# Patient Record
Sex: Female | Born: 1990 | Race: White | Hispanic: No | Marital: Single | Smoking: Never smoker
Health system: Southern US, Community
[De-identification: ages and names within clinical notes are randomized; demographics above are authoritative.]

## PROBLEM LIST (undated history)

## (undated) DIAGNOSIS — Q210 Ventricular septal defect: Secondary | ICD-10-CM

## (undated) DIAGNOSIS — I369 Nonrheumatic tricuspid valve disorder, unspecified: Secondary | ICD-10-CM

## (undated) DIAGNOSIS — I509 Heart failure, unspecified: Secondary | ICD-10-CM

## (undated) DIAGNOSIS — R011 Cardiac murmur, unspecified: Secondary | ICD-10-CM

## (undated) DIAGNOSIS — Q224 Congenital tricuspid stenosis: Secondary | ICD-10-CM

## (undated) HISTORY — DX: Congenital tricuspid stenosis: Q22.4

## (undated) HISTORY — DX: Ventricular septal defect: Q21.0

## (undated) HISTORY — DX: Cardiac murmur, unspecified: R01.1

## (undated) HISTORY — DX: Nonrheumatic tricuspid valve disorder, unspecified: I36.9

---
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
## 2009-02-22 ENCOUNTER — Ambulatory Visit: Payer: Self-pay | Admitting: Family Medicine

## 2009-02-22 DIAGNOSIS — M4120 Other idiopathic scoliosis, site unspecified: Secondary | ICD-10-CM | POA: Insufficient documentation

## 2009-02-22 DIAGNOSIS — M545 Low back pain: Secondary | ICD-10-CM

## 2011-03-08 ENCOUNTER — Other Ambulatory Visit (HOSPITAL_COMMUNITY): Payer: Self-pay | Admitting: Cardiology

## 2011-03-08 DIAGNOSIS — Q210 Ventricular septal defect: Secondary | ICD-10-CM

## 2011-03-20 ENCOUNTER — Other Ambulatory Visit (HOSPITAL_COMMUNITY): Payer: Self-pay

## 2011-03-20 ENCOUNTER — Encounter (HOSPITAL_COMMUNITY)
Admission: RE | Admit: 2011-03-20 | Discharge: 2011-03-20 | Disposition: A | Discharge status: Home / Self Care | Payer: BC Managed Care – PPO | Source: Ambulatory Visit | Attending: Cardiology | Admitting: Cardiology

## 2011-03-20 ENCOUNTER — Ambulatory Visit (HOSPITAL_COMMUNITY)
Admission: RE | Admit: 2011-03-20 | Discharge: 2011-03-20 | Disposition: A | Discharge status: Home / Self Care | Payer: BC Managed Care – PPO | Source: Ambulatory Visit | Attending: Cardiology | Admitting: Cardiology

## 2011-03-20 DIAGNOSIS — Q210 Ventricular septal defect: Secondary | ICD-10-CM

## 2011-03-20 DIAGNOSIS — R05 Cough: Secondary | ICD-10-CM | POA: Insufficient documentation

## 2011-03-20 DIAGNOSIS — R5381 Other malaise: Secondary | ICD-10-CM | POA: Insufficient documentation

## 2011-03-20 DIAGNOSIS — R0602 Shortness of breath: Secondary | ICD-10-CM | POA: Insufficient documentation

## 2011-03-20 DIAGNOSIS — R059 Cough, unspecified: Secondary | ICD-10-CM | POA: Insufficient documentation

## 2011-03-20 LAB — URINALYSIS, ROUTINE W REFLEX MICROSCOPIC
Bilirubin Urine: NEGATIVE
Glucose, UA: NEGATIVE
Hgb urine dipstick: NEGATIVE
Ketones, ur: NEGATIVE
Specific Gravity, Urine: 1.009
pH: 7

## 2011-03-20 LAB — URINE CULTURE: Colony Count: 4000

## 2011-03-20 LAB — PREGNANCY, URINE: Preg Test, Ur: NEGATIVE

## 2011-03-20 MED ORDER — TECHNETIUM TO 99M ALBUMIN AGGREGATED
6.0000 | Freq: Once | INTRAVENOUS | Status: AC | PRN
  Administered 2011-03-20: 6 via INTRAVENOUS

## 2011-03-20 MED ORDER — XENON XE 133 GAS
10.0000 | GAS_FOR_INHALATION | Freq: Once | RESPIRATORY_TRACT | Status: AC | PRN
  Administered 2011-03-20: 10 via RESPIRATORY_TRACT

## 2011-03-22 ENCOUNTER — Other Ambulatory Visit (HOSPITAL_COMMUNITY): Payer: Self-pay

## 2012-08-28 IMAGING — NM NM PULMONARY VENT & PERF
2 series · 12 of 12 positions shown · non-contrast
Comparison: Chest x-ray today.

CLINICAL DATA: Cough, weakness.

NUCLEAR MEDICINE VENTILATION - PERFUSION LUNG SCAN
TECHNIQUE: Wash-in, equilibrium, and wash-out phase ventilation
images were obtained using 6e-1KK gas.  Perfusion images were
obtained in multiple projections after intravenous injection of Tc-
99m MAA.
Radiopharmaceuticals:  10.0 mCi 6e-1KK gas and 6.0 mCi Wc-IIm MAA.

[vq scan · 2.52mm/px · 6 of 20 frames shown (1 of 2)]
[frame 2/20  full-range]
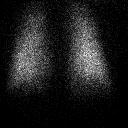
[frame 5/20]
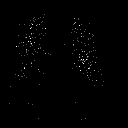
[frame 9/20]
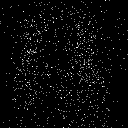
[frame 12/20]
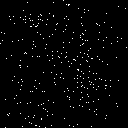
[frame 15/20]
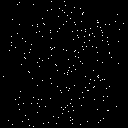
[frame 19/20]
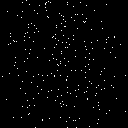

[vq scan · 2.52mm/px · 6 of 20 frames shown (2 of 2)]
[frame 2/20  full-range]
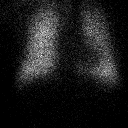
[frame 5/20  full-range]
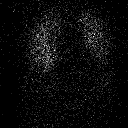
[frame 9/20]
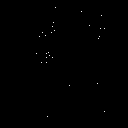
[frame 12/20]
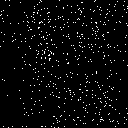
[frame 15/20]
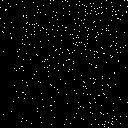
[frame 19/20]
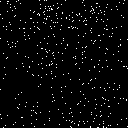

[12 of 12 positions shown; findings below may reference images not displayed]

FINDINGS: No ventilation or perfusion defects noted to suggest
pulmonary emboli.  No VQ mismatches.
IMPRESSION: Normal study.  No evidence of pulmonary embolus.

## 2013-02-03 ENCOUNTER — Ambulatory Visit (HOSPITAL_COMMUNITY)
Admission: RE | Admit: 2013-02-03 | Discharge: 2013-02-03 | Disposition: A | Discharge status: Home / Self Care | Payer: BC Managed Care – PPO | Source: Ambulatory Visit | Attending: Obstetrics and Gynecology | Admitting: Obstetrics and Gynecology

## 2013-02-03 ENCOUNTER — Ambulatory Visit (HOSPITAL_COMMUNITY): Payer: BC Managed Care – PPO

## 2013-02-03 DIAGNOSIS — Q224 Congenital tricuspid stenosis: Secondary | ICD-10-CM

## 2013-02-03 DIAGNOSIS — Z3169 Encounter for other general counseling and advice on procreation: Secondary | ICD-10-CM

## 2013-02-03 NOTE — ED Notes (Signed)
BP 124/86, P 74, Wt 136lb

## 2013-02-04 NOTE — Progress Notes (Signed)
MATERNAL FETAL MEDICINE CONSULT  Patient Name: Brianna Matthews Medical Record Number:  [id number] Date of Birth: 12-Feb-1991 Requesting Physician Name:  No att. 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 Zelphia Cairo.  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  She and her mother (who accompanied her on today's visit) were not certain about the exact type of Fontan circulation, i.e. fenestrated vs non-fenestrated, intracardiac vs. Extracardiac.  She has been very stable from a cardiovascular standpoint for many years.  She handles a light amount of activity, i.e. walking, climbing stairs, activities of daily living, very well.  She does have moderate to severe exercise intolerance.  She has not had any problem with arrhythmias.  She is followed by Dr. Mayford Knife in Cardiology.  Her last echo was earlier this year, which she reports as normal.  A report of that study is not available for review.  She is currently taking a baby aspirin, oral contraceptive pills, and a multivitamin.  She is not an any cardiac medications save for the aspirin.    Review of Systems Pertinent items are noted in HPI.  OB History Nulligravida  Medical History Congenital Tricuspid atresia s/p Fontan  Surgical History Cardiac surgery x3 as child last at age 14.  History   Social History  . Marital Status: Single    Spouse Name: N/A    Number of Children: N/A  . Years of Education: N/A   Social History Main Topics  . Smoking status: Not on file  . Smokeless tobacco: Not on file  . Alcohol Use: Not on file  . Drug  Use: Not on file  . Sexual Activity: Not on file   Other Topics Concern  . Not on file   Social History Narrative  . No narrative on file    Family History In addition, the patient has no family history of mental retardation, birth defects, or genetic diseases.  Physical Examination Vitals - BP 124/86, Pulse 74, Weight 136 lbs General appearance - alert, well appearing, and in no distress  Assessment and Recommendations 1.  Tricuspid atresia with Fontan circulation.  I reviewed in detail the nature of the altered cardiac physiology that Brianna Matthews has as a result of her congenital cardiac disease and the three surgeries she has as a child.  I also review the complications that women with a Fontan circulation face in pregnancy.  Fortunately, Brianna Matthews's functional status is excellent at this time and she has no history of arrhythmias.  While this should decrease the risks if she were to conceive, there would still be a substantial risk of complications.  The most common medical complications in women with a Fontan circulation include arrythmia, heart failure, and venous congestion as the normal blood volume expansion of pregnancy occurs.  Most often these complications can be managed with a combination of beta-blockers and diuretics.  There is very little risk of maternal mortality and any cardiac issues typically resolve soon after delivery.  Unfortunately, women with a Fontan circulation and also at a very high risk of miscarriage (up to 1 out of 3 women) and preterm labor and delivery which poses  a significant of prematurity complications to the newborn.  If Brianna Matthews were to become pregnant she would require frequent prenatal visits and the entirety of her prenatal care should be provided by a perinatologist with experience caring for women with congenital heart disease.  She should also be closely followed by a cardiologist with experience in adult congential heart disease in pregnancy.   Maternal cardiac function must be monitored closely with serial maternal echocardiograms.  Delivery at a tertiary care center is necessary with post-partum monitoring typically carried out in an ICU setting.  I have referred Brianna Matthews to Dr. Ambrose Mantle, a Cardiologist at [facility] with experience caring for women with congenital heart disease in pregnancy.  I have also ordered a maternal echocardiogram at [facility] so that Dr. Anner Crete can more accurately assess Brianna Matthews present cardiac function and risk of complications in pregnancy.  I will see Brianna Matthews back in follow up when the echo and Cardiology consult has been completed.  I spent 45 minutes with Brianna Matthews today of which 50% was face-to-face counseling.  Thank you for referring Brianna Matthews to the [facility].  Please do not hesitate to contact us with questions.   Rema Fendt, MD

## 2013-06-22 ENCOUNTER — Encounter: Payer: Self-pay | Admitting: General Surgery

## 2013-07-10 ENCOUNTER — Ambulatory Visit: Payer: BC Managed Care – PPO | Admitting: Cardiology

## 2013-07-13 ENCOUNTER — Other Ambulatory Visit (HOSPITAL_COMMUNITY): Payer: BC Managed Care – PPO

## 2014-06-15 ENCOUNTER — Ambulatory Visit: Payer: BC Managed Care – PPO | Admitting: Cardiology

## 2014-07-08 ENCOUNTER — Encounter: Payer: Self-pay | Admitting: *Deleted

## 2014-07-08 ENCOUNTER — Telehealth: Payer: Self-pay | Admitting: Cardiology

## 2014-07-08 NOTE — Telephone Encounter (Signed)
[facility], records received back  placed in chart prep bin.

## 2014-07-09 ENCOUNTER — Ambulatory Visit (INDEPENDENT_AMBULATORY_CARE_PROVIDER_SITE_OTHER): Payer: BLUE CROSS/BLUE SHIELD | Admitting: Cardiology

## 2014-07-09 ENCOUNTER — Encounter: Payer: Self-pay | Admitting: Cardiology

## 2014-07-09 VITALS — BP 104/72 | HR 67 | Ht 64.0 in | Wt 138.8 lb

## 2014-07-09 DIAGNOSIS — Q228 Other congenital malformations of tricuspid valve: Secondary | ICD-10-CM

## 2014-07-09 DIAGNOSIS — Q210 Ventricular septal defect: Secondary | ICD-10-CM

## 2014-07-09 DIAGNOSIS — R011 Cardiac murmur, unspecified: Secondary | ICD-10-CM

## 2014-07-09 DIAGNOSIS — Q224 Congenital tricuspid stenosis: Secondary | ICD-10-CM

## 2014-07-09 NOTE — Patient Instructions (Addendum)
You have been referred to Dr[facility] Candida [facility]elingom Basher at [facility] for Adult Congential Heart Disease.   Your physician wants you to follow-up in: 1 year with Dr. Mayford Knifeurner. You will receive a reminder letter in the mail two months in advance. If you don't receive a letter, please call our office to schedule the follow-up appointment.

## 2014-07-09 NOTE — Progress Notes (Signed)
Cardiology Office Note   Date:  07/09/2014   ID:  Brianna Matthews, DOB [birth year], MRN [id number]  PCP:  No primary care provider on file.  Cardiologist:   Quintella ReichertURNER,Nolia Tschantz R, MD   No chief complaint on file.     History of Present Illness: Brianna Matthews is a 24 y.o. female who presents for followup of tricuspid valve atresia with normal great arteries normal great arteries s/p Waterston shunt as a newborn, left modified BT shunt at 7 months with bidirectional bilateral caval pulomonary anastomosis with RPA PTCA and takedown of [place] shunt but left BT shunt left patent due to cyanosis [place] and closure of fenestrated Fontan at [facility] who was being [facility] but her MD moved.  She is doing well today. She denise any SOB, DOE, LE edema, palpitations or syncope.  She has had some problems with vertigo.  She denies any chest pain except for one episode of sharp pain last year that was very brief.      Past Medical History  Diagnosis Date  . Tricuspid atresia   . VSD (ventricular septal defect)     s/p closure  . Ventricular septal defect   . Tricuspid valve disorders, specified as nonrheumatic     History reviewed. No pertinent past surgical history.   Current Outpatient Prescriptions  Medication Sig Dispense Refill  . aspirin 81 MG tablet Take 81 mg by mouth daily.    . Multiple Vitamins-Minerals (MULTIVITAMIN ADULT PO) Take 1 tablet by mouth daily.    . Norethindrone-Ethinyl Estradiol-Fe Biphas (LO LOESTRIN FE) 1 MG-10 MCG / 10 MCG tablet Take 1 tablet by mouth daily.     No current facility-administered medications for this visit.    Allergies:   Review of patient's allergies indicates no known allergies.    Social History:  The patient  reports that she has never smoked. She does not have any smokeless tobacco history on file. She reports that she does not drink alcohol or use illicit drugs.   Family History:  The patient's  family history includes Heart attack in her father; Hyperlipidemia in her father and mother; Hypertension in her father and mother.    ROS:  Please see the history of present illness.   Otherwise, review of systems are positive for none.   All other systems are reviewed and negative.    PHYSICAL EXAM: VS:  BP 104/72 mmHg  Pulse 67  Ht 5\' 4"  (1.626 m)  Wt 138 lb 12.8 oz (62.959 kg)  BMI 23.81 kg/m2 , BMI Body mass index is 23.81 kg/(m^2). GEN: Well nourished, well developed, in no acute distress HEENT: normal Neck: no JVD, carotid bruits, or masses Cardiac: RRR; no murmurs, rubs, or gallops,no edema  Respiratory:  clear to auscultation bilaterally, normal work of breathing GI: soft, nontender, nondistended, + BS MS: no deformity or atrophy Skin: warm and dry, no rash Neuro:  Strength and sensation are intact Psych: euthymic mood, full affect   EKG:  EKG is ordered today. The ekg ordered today demonstrates ectopic atrial rhythm with nonspecific ST abnormality   Recent Labs: No results found for requested labs within last 365 days.    Lipid Panel No results found for: CHOL, TRIG, HDL, CHOLHDL, VLDL, LDLCALC, LDLDIRECT    Wt Readings from Last 3 Encounters:  07/09/14 138 lb 12.8 oz (62.959 kg)  02/22/09 134 lb (60.782 kg) (63 %*, Z = 0.34)  12/14/08 134 lb (60.782 kg) (64 %*, Z =  0.36)   * Growth percentiles are based on CDC 2-20 Years data.      Other studies Reviewed: Additional studies/ records that were reviewed today include: Cardiac MRI and echo from [facility].    ASSESSMENT AND PLAN:  1.  VSD  s/p multiple corrective surgeries including.  She is completely asymptomatic at this time.   - I have recommended that she be set up with one of the Adult congenital heart specialists at [facility] since we do not have anyone in our practice with advanced training in this and she had very complicated anatomy given her multiple cardiac surgeries through the years.  I will  continue to follow her for any acute cardiac problems but would like her followed at least yearly by [facility]  I have referred her to Dr. Earma Reading. 2.  TV atresia  3.  Heart murmur   Current medicines are reviewed at length with the patient today.  The patient does not have concerns regarding medicines.  The following changes have been made:  no change  Labs/ tests ordered today include: None     Disposition:   FU with me in 1 year   Signed, Quintella Reichert, MD  07/09/2014 3:36 PM    [facility] [address] [place], [place], [place]  [address] Phone: [phone]; Fax: [phone]

## 2014-07-11 ENCOUNTER — Encounter: Payer: Self-pay | Admitting: Cardiology

## 2014-07-11 DIAGNOSIS — Q210 Ventricular septal defect: Secondary | ICD-10-CM | POA: Insufficient documentation

## 2014-07-11 DIAGNOSIS — R011 Cardiac murmur, unspecified: Secondary | ICD-10-CM

## 2014-07-11 HISTORY — DX: Ventricular septal defect: Q21.0

## 2014-07-11 HISTORY — DX: Cardiac murmur, unspecified: R01.1

## 2016-08-12 ENCOUNTER — Emergency Department (HOSPITAL_BASED_OUTPATIENT_CLINIC_OR_DEPARTMENT_OTHER)
Admission: EM | Admit: 2016-08-12 | Discharge: 2016-08-12 | Disposition: A | Discharge status: Home / Self Care | Payer: BLUE CROSS/BLUE SHIELD | Attending: Emergency Medicine | Admitting: Emergency Medicine

## 2016-08-12 ENCOUNTER — Encounter (HOSPITAL_BASED_OUTPATIENT_CLINIC_OR_DEPARTMENT_OTHER): Payer: Self-pay | Admitting: Emergency Medicine

## 2016-08-12 DIAGNOSIS — R05 Cough: Secondary | ICD-10-CM | POA: Diagnosis not present

## 2016-08-12 DIAGNOSIS — Z79899 Other long term (current) drug therapy: Secondary | ICD-10-CM | POA: Insufficient documentation

## 2016-08-12 DIAGNOSIS — Z7982 Long term (current) use of aspirin: Secondary | ICD-10-CM | POA: Insufficient documentation

## 2016-08-12 DIAGNOSIS — E876 Hypokalemia: Secondary | ICD-10-CM | POA: Insufficient documentation

## 2016-08-12 DIAGNOSIS — R1013 Epigastric pain: Secondary | ICD-10-CM

## 2016-08-12 DIAGNOSIS — R197 Diarrhea, unspecified: Secondary | ICD-10-CM | POA: Diagnosis present

## 2016-08-12 LAB — CBC
HCT: 44.6 % (ref 36.0–46.0)
Hemoglobin: 15.6 g/dL — ABNORMAL HIGH (ref 12.0–15.0)
MCH: 31.1 pg (ref 26.0–34.0)
MCHC: 35 g/dL (ref 30.0–36.0)
MCV: 89 fL (ref 78.0–100.0)
PLATELETS: 163 10*3/uL (ref 150–400)
RBC: 5.01 MIL/uL (ref 3.87–5.11)
RDW: 13 % (ref 11.5–15.5)
WBC: 6.2 10*3/uL (ref 4.0–10.5)

## 2016-08-12 LAB — COMPREHENSIVE METABOLIC PANEL
ALT: 54 U/L (ref 14–54)
AST: 41 U/L (ref 15–41)
Albumin: 4 g/dL (ref 3.5–5.0)
Alkaline Phosphatase: 68 U/L (ref 38–126)
Anion gap: 9 (ref 5–15)
BUN: 7 mg/dL (ref 6–20)
CHLORIDE: 106 mmol/L (ref 101–111)
CO2: 22 mmol/L (ref 22–32)
CREATININE: 0.61 mg/dL (ref 0.44–1.00)
Calcium: 8.6 mg/dL — ABNORMAL LOW (ref 8.9–10.3)
GFR calc non Af Amer: >60 mL/min (ref >60)
Glucose, Bld: 96 mg/dL (ref 65–99)
Potassium: 2.8 mmol/L — ABNORMAL LOW (ref 3.5–5.1)
SODIUM: 137 mmol/L (ref 135–145)
Total Bilirubin: 1.1 mg/dL (ref 0.3–1.2)
Total Protein: 7.2 g/dL (ref 6.5–8.1)

## 2016-08-12 LAB — URINALYSIS, ROUTINE W REFLEX MICROSCOPIC
Bilirubin Urine: NEGATIVE
Glucose, UA: NEGATIVE mg/dL
Ketones, ur: 15 mg/dL — AB
Leukocytes, UA: NEGATIVE
Nitrite: NEGATIVE
Protein, ur: NEGATIVE mg/dL
Specific Gravity, Urine: 1.006 (ref 1.005–1.030)
pH: 6 (ref 5.0–8.0)

## 2016-08-12 LAB — URINALYSIS, MICROSCOPIC (REFLEX)

## 2016-08-12 LAB — LIPASE, BLOOD: Lipase: 31 U/L (ref 11–51)

## 2016-08-12 LAB — PREGNANCY, URINE: Preg Test, Ur: NEGATIVE

## 2016-08-12 MED ORDER — ALUM & MAG HYDROXIDE-SIMETH 200-200-20 MG/5ML PO SUSP
30.0000 mL | Freq: Once | ORAL | Status: AC
  Administered 2016-08-12: 30 mL via ORAL
  Filled 2016-08-12: qty 30

## 2016-08-12 MED ORDER — SODIUM CHLORIDE 0.9 % IV BOLUS (SEPSIS)
1000.0000 mL | Freq: Once | INTRAVENOUS | Status: AC
  Administered 2016-08-12: 1000 mL via INTRAVENOUS

## 2016-08-12 MED ORDER — PANTOPRAZOLE SODIUM 40 MG PO TBEC: 40.0000 mg | DELAYED_RELEASE_TABLET | Freq: Every day | ORAL | 0 refills | Status: AC

## 2016-08-12 MED ORDER — POTASSIUM CHLORIDE CRYS ER 20 MEQ PO TBCR: 20.0000 meq | EXTENDED_RELEASE_TABLET | Freq: Every day | ORAL | 0 refills | Status: AC

## 2016-08-12 MED ORDER — FAMOTIDINE IN NACL 20-0.9 MG/50ML-% IV SOLN
20.0000 mg | Freq: Once | INTRAVENOUS | Status: AC
  Administered 2016-08-12: 20 mg via INTRAVENOUS
  Filled 2016-08-12: qty 50

## 2016-08-12 MED ORDER — POTASSIUM CHLORIDE CRYS ER 20 MEQ PO TBCR
40.0000 meq | EXTENDED_RELEASE_TABLET | Freq: Once | ORAL | Status: AC
  Administered 2016-08-12: 40 meq via ORAL
  Filled 2016-08-12: qty 2

## 2016-08-12 NOTE — ED Triage Notes (Signed)
Epigastric pain since last night and diarrhea off and on x 1 week. Eval at Urgent Care on Tuesday and given nausea med, with some relief

## 2016-08-12 NOTE — ED Provider Notes (Addendum)
[facility] Provider Note   CSN: [id number] Arrival date & time: 08/12/16  1652   By signing my name below, I, Clarisse Gouge, attest that this documentation has been prepared under the direction and in the presence of Cathren Laine, MD. Electronically signed, Clarisse Gouge, ED Scribe. 08/12/16. 6:20 PM.   History   Chief Complaint Chief Complaint  Patient presents with  . Abdominal Pain   The history is provided by the patient and medical records. No language interpreter was used.    HPI Comments: Brianna Matthews is a 26 y.o. female who presents to the Emergency Department complaining of recurring N/V/D x 1 week. She notes nausea and watery diarrhea today. Pt notes associated waxing and waning abdominal pain, cough and chills. She states she was seen at a UC on 08/07/2016 for similar symptoms where she was given zofran with relief to symptoms. She states her symptoms subsided over the past 2 days and returned today. No Hx of ulcers, pancreas or gallbladder complications. No heart issues reported since childhood. Pt denies fever, flank pain, chest pain, dysuria, hematuria, vaginal bleeding or vaginal discharge. LNMP last week.    Past Medical History:  Diagnosis Date  . Heart murmur 07/11/2014  . Tricuspid atresia   . Tricuspid valve disorders, specified as nonrheumatic   . Ventricular septal defect   . VSD (ventricular septal defect)    s/p closure  . VSD (ventricular septal defect) 07/11/2014    Patient Active Problem List   Diagnosis Date Noted  . VSD (ventricular septal defect) 07/11/2014  . Heart murmur 07/11/2014  . LUMBAGO 02/22/2009  . SCOLIOSIS 02/22/2009  . MONONUCLEOSIS 03/15/2008  . LIVER FUNCTION TESTS, ABNORMAL, HX OF 03/15/2008  . ALLERGIC RHINITIS 12/26/2007  . CONGENITAL TRICUSPID ATRESIA AND STENOSIS 12/26/2007    History reviewed. No pertinent surgical history.  OB History    No data available       Home Medications    Prior to  Admission medications   Medication Sig Start Date End Date Taking? Authorizing Provider  aspirin 81 MG tablet Take 81 mg by mouth daily.   Yes Historical Provider, MD  Norethindrone-Ethinyl Estradiol-Fe Biphas (LO LOESTRIN FE) 1 MG-10 MCG / 10 MCG tablet Take 1 tablet by mouth daily.   Yes Historical Provider, MD  Multiple Vitamins-Minerals (MULTIVITAMIN ADULT PO) Take 1 tablet by mouth daily.    Historical Provider, MD    Family History Family History  Problem Relation Age of Onset  . Hyperlipidemia Mother   . Hypertension Mother   . Hyperlipidemia Father   . Hypertension Father   . Heart attack Father     Social History Social History  Substance Use Topics  . Smoking status: Never Smoker  . Smokeless tobacco: Never Used  . Alcohol use No     Allergies   Patient has no known allergies.   Review of Systems Review of Systems  Constitutional: Positive for chills. Negative for fever.  HENT: Negative for sore throat.   Eyes: Negative for redness.  Respiratory: Positive for cough. Negative for shortness of breath.   Cardiovascular: Negative for chest pain.  Gastrointestinal: Positive for abdominal pain, diarrhea, nausea and vomiting.  Endocrine: Negative for polyuria.  Genitourinary: Negative for dysuria, flank pain, hematuria, vaginal bleeding and vaginal discharge.  Musculoskeletal: Negative for back pain.  Neurological: Negative for headaches.  Hematological: Negative for adenopathy.  Psychiatric/Behavioral: Negative for confusion.  All other systems reviewed and are negative.    Physical Exam Updated Vital  Signs BP 127/89 (BP Location: Right Arm)   Pulse 105   Temp 97.9 F (36.6 C) (Oral)   Resp 18   Ht 5\' 4"  (1.626 m)   Wt 128 lb (58.1 kg)   LMP 08/06/2016 (Exact Date)   SpO2 98%   BMI 21.97 kg/m   Physical Exam  Constitutional: She is oriented to person, place, and time. She appears well-developed and well-nourished. No distress.  HENT:  Head:  Normocephalic and atraumatic.  Mouth/Throat: Oropharynx is clear and moist.  Eyes: Conjunctivae are normal. No scleral icterus.  Neck: Normal range of motion. Neck supple.  Cardiovascular: Normal rate, regular rhythm, normal heart sounds and intact distal pulses.  Exam reveals no gallop and no friction rub.   No murmur heard. Pulmonary/Chest: Effort normal and breath sounds normal. She has no wheezes. She has no rales. She exhibits no tenderness.  Abdominal: Soft. Bowel sounds are normal. She exhibits no distension and no mass. There is tenderness (mild) in the epigastric area. There is no rebound and no guarding. No hernia.  Genitourinary:  Genitourinary Comments: No cva tenderness  Musculoskeletal: She exhibits no edema or tenderness.  Neurological: She is alert and oriented to person, place, and time.  Skin: Skin is warm and dry. No rash noted. She is not diaphoretic.  Psychiatric: She has a normal mood and affect. Her behavior is normal.  Nursing note and vitals reviewed.    ED Treatments / Results  DIAGNOSTIC STUDIES: Oxygen Saturation is 98% on RA, normal by my interpretation.    COORDINATION OF CARE: 6:17 PM Discussed treatment plan with pt at bedside and pt agreed to plan. Will order fluids, medications and labs.  Labs (all labs ordered are listed, but only abnormal results are displayed) Results for orders placed or performed during the hospital encounter of 08/12/16  Urinalysis, Routine w reflex microscopic  Result Value Ref Range   Color, Urine YELLOW YELLOW   APPearance CLEAR CLEAR   Specific Gravity, Urine 1.006 1.005 - 1.030   pH 6.0 5.0 - 8.0   Glucose, UA NEGATIVE NEGATIVE mg/dL   Hgb urine dipstick TRACE (A) NEGATIVE   Bilirubin Urine NEGATIVE NEGATIVE   Ketones, ur 15 (A) NEGATIVE mg/dL   Protein, ur NEGATIVE NEGATIVE mg/dL   Nitrite NEGATIVE NEGATIVE   Leukocytes, UA NEGATIVE NEGATIVE  Pregnancy, urine  Result Value Ref Range   Preg Test, Ur NEGATIVE  NEGATIVE  Urinalysis, Microscopic (reflex)  Result Value Ref Range   RBC / HPF 0-5 0 - 5 RBC/hpf   WBC, UA 0-5 0 - 5 WBC/hpf   Bacteria, UA RARE (A) NONE SEEN   Squamous Epithelial / LPF 0-5 (A) NONE SEEN  CBC  Result Value Ref Range   WBC 6.2 4.0 - 10.5 K/uL   RBC 5.01 3.87 - 5.11 MIL/uL   Hemoglobin 15.6 (H) 12.0 - 15.0 g/dL   HCT 16.144.6 09.636.0 - 04.546.0 %   MCV 89.0 78.0 - 100.0 fL   MCH 31.1 26.0 - 34.0 pg   MCHC 35.0 30.0 - 36.0 g/dL   RDW 40.913.0 81.111.5 - 91.415.5 %   Platelets 163 150 - 400 K/uL  Comprehensive metabolic panel  Result Value Ref Range   Sodium 137 135 - 145 mmol/L   Potassium 2.8 (L) 3.5 - 5.1 mmol/L   Chloride 106 101 - 111 mmol/L   CO2 22 22 - 32 mmol/L   Glucose, Bld 96 65 - 99 mg/dL   BUN 7 6 - 20 mg/dL  Creatinine, Ser 0.61 0.44 - 1.00 mg/dL   Calcium 8.6 (L) 8.9 - 10.3 mg/dL   Total Protein 7.2 6.5 - 8.1 g/dL   Albumin 4.0 3.5 - 5.0 g/dL   AST 41 15 - 41 U/L   ALT 54 14 - 54 U/L   Alkaline Phosphatase 68 38 - 126 U/L   Total Bilirubin 1.1 0.3 - 1.2 mg/dL   GFR calc non Af Amer >60 >60 mL/min   GFR calc Af Amer >60 >60 mL/min   Anion gap 9 5 - 15  Lipase, blood  Result Value Ref Range   Lipase 31 11 - 51 U/L   EKG  EKG Interpretation None       Radiology No results found.  Procedures Procedures (including critical care time)  Medications Ordered in ED Medications - No data to display   Initial Impression / Assessment and Plan / ED Course  I have reviewed the triage vital signs and the nursing notes.  Pertinent labs & imaging results that were available during my care of the patient were reviewed by me and considered in my medical decision making (see chart for details).  I personally performed the services described in this documentation, which was scribed in my presence. The recorded information has been reviewed and considered. Cathren Laine, MD  Iv ns bolus. pepcid iv.  Labs.  Reviewed nursing notes and prior charts for additional  history.   k low on labs. kcl 40 meq po.  Trial po fluids-  Tolerates.  No episodes of vomiting or diarrhea in ED.   Recheck bd soft nt.   Pt currently appears stable for d/c.      Final Clinical Impressions(s) / ED Diagnoses   Final diagnoses:  None    New Prescriptions New Prescriptions   No medications on file        Cathren Laine, MD 08/12/16 2041

## 2016-08-12 NOTE — Discharge Instructions (Signed)
It was our pleasure to provide your ER care today - we hope that you feel better.  Rest. Drink plenty of fluids.  Take protonix (acid blocker medication).  You may also try gi meds like maalox or pepcid as need for symptom relief.  From todays lab tests, your potassium level is low (2.8) - eat plenty of fruits and vegetables, take supplement for the next few days as prescribed, and follow up with primary care doctor in 1 week.   Follow up with primary care doctor in the coming week.  Return to ER right away if worse, worsening or severe abdominal pain, high fevers, persistent vomiting, other concern.

## 2018-01-15 ENCOUNTER — Other Ambulatory Visit: Payer: Self-pay | Admitting: Family Medicine

## 2018-01-15 ENCOUNTER — Ambulatory Visit
Admission: RE | Admit: 2018-01-15 | Discharge: 2018-01-15 | Disposition: A | Discharge status: Home / Self Care | Payer: BLUE CROSS/BLUE SHIELD | Source: Ambulatory Visit | Attending: Family Medicine | Admitting: Family Medicine

## 2018-01-15 DIAGNOSIS — R0600 Dyspnea, unspecified: Secondary | ICD-10-CM

## 2021-06-14 ENCOUNTER — Ambulatory Visit
Admission: RE | Admit: 2021-06-14 | Discharge: 2021-06-14 | Disposition: A | Discharge status: Home / Self Care | Payer: No Typology Code available for payment source | Source: Ambulatory Visit | Attending: General Surgery | Admitting: General Surgery

## 2021-06-14 ENCOUNTER — Other Ambulatory Visit: Payer: Self-pay | Admitting: General Surgery

## 2021-06-14 DIAGNOSIS — R222 Localized swelling, mass and lump, trunk: Secondary | ICD-10-CM

## 2024-05-01 ENCOUNTER — Other Ambulatory Visit: Payer: Self-pay

## 2024-05-01 ENCOUNTER — Emergency Department (HOSPITAL_COMMUNITY)

## 2024-05-01 ENCOUNTER — Emergency Department (HOSPITAL_COMMUNITY)
Admission: EM | Admit: 2024-05-01 | Discharge: 2024-05-01 | Disposition: A | Discharge status: Home / Self Care | Attending: Emergency Medicine | Admitting: Emergency Medicine

## 2024-05-01 ENCOUNTER — Encounter (HOSPITAL_COMMUNITY): Payer: Self-pay | Admitting: *Deleted

## 2024-05-01 DIAGNOSIS — R079 Chest pain, unspecified: Secondary | ICD-10-CM | POA: Insufficient documentation

## 2024-05-01 DIAGNOSIS — Z7982 Long term (current) use of aspirin: Secondary | ICD-10-CM | POA: Diagnosis not present

## 2024-05-01 HISTORY — DX: Heart failure, unspecified: I50.9

## 2024-05-01 LAB — HEPATIC FUNCTION PANEL
ALT: 33 U/L (ref 0–44)
AST: 38 U/L (ref 15–41)
Albumin: 4 g/dL (ref 3.5–5.0)
Alkaline Phosphatase: 85 U/L (ref 38–126)
Bilirubin, Direct: 0.2 mg/dL (ref 0.0–0.2)
Indirect Bilirubin: 0.6 mg/dL (ref 0.3–0.9)
Total Bilirubin: 0.8 mg/dL (ref 0.0–1.2)
Total Protein: 7.3 g/dL (ref 6.5–8.1)

## 2024-05-01 LAB — CBC
HCT: 43 % (ref 36.0–46.0)
Hemoglobin: 14.7 g/dL (ref 12.0–15.0)
MCH: 31.8 pg (ref 26.0–34.0)
MCHC: 34.2 g/dL (ref 30.0–36.0)
MCV: 93.1 fL (ref 80.0–100.0)
Platelets: 142 K/uL — ABNORMAL LOW (ref 150–400)
RBC: 4.62 MIL/uL (ref 3.87–5.11)
RDW: 12.7 % (ref 11.5–15.5)
WBC: 6.4 K/uL (ref 4.0–10.5)
nRBC: 0 % (ref 0.0–0.2)

## 2024-05-01 LAB — BASIC METABOLIC PANEL WITH GFR
Anion gap: 13 (ref 5–15)
BUN: 24 mg/dL — ABNORMAL HIGH (ref 6–20)
CO2: 20 mmol/L — ABNORMAL LOW (ref 22–32)
Calcium: 9.1 mg/dL (ref 8.9–10.3)
Chloride: 105 mmol/L (ref 98–111)
Creatinine, Ser: 0.7 mg/dL (ref 0.44–1.00)
GFR, Estimated: >60 mL/min (ref >60)
Glucose, Bld: 92 mg/dL (ref 70–99)
Potassium: 4 mmol/L (ref 3.5–5.1)
Sodium: 138 mmol/L (ref 135–145)

## 2024-05-01 LAB — HCG, SERUM, QUALITATIVE: Preg, Serum: NEGATIVE

## 2024-05-01 LAB — MAGNESIUM: Magnesium: 2 mg/dL (ref 1.7–2.4)

## 2024-05-01 LAB — TSH: TSH: 1.991 u[IU]/mL (ref 0.350–4.500)

## 2024-05-01 LAB — TROPONIN I (HIGH SENSITIVITY)
Troponin I (High Sensitivity): 3 ng/L (ref <18)
Troponin I (High Sensitivity): 4 ng/L (ref <18)

## 2024-05-01 LAB — LIPASE, BLOOD: Lipase: 49 U/L (ref 11–51)

## 2024-05-01 LAB — BRAIN NATRIURETIC PEPTIDE: B Natriuretic Peptide: 38.7 pg/mL (ref 0.0–100.0)

## 2024-05-01 LAB — D-DIMER, QUANTITATIVE: D-Dimer, Quant: <0.27 ug{FEU}/mL (ref 0.00–0.50)

## 2024-05-01 NOTE — ED Notes (Signed)
 Call to lab to add blood to previously drawn blood

## 2024-05-01 NOTE — ED Provider Notes (Signed)
 [place] EMERGENCY DEPARTMENT AT [facility] Provider Note   CSN: [id number] Arrival date & time: 05/01/24  1044     Patient presents with: Chest Pain   Brianna Matthews is a 33 y.o. female.    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  She has recently been diagnosed with some early mild congestive heart failure, she is taking the medications that she has been prescribed and is doing overall very well noting that she is usually able to do some cardio activity including walking 20 or 30 minutes a day, she even was on the elliptical last night and doing okay however she started to have some chest heaviness and some shortness of breath that started last night and has continued over the course of the evening and into today.  She denies any swelling of the legs, no coughing or fever, she has no worsening pain with deep breathing or position, she does have some exertional component to this.  She denies any history of obstructive coronary disease, she does take a baby aspirin daily.  She called her cardiology team at [facility] who recommended that she come to the hospital.    Prior to Admission medications   Medication Sig Start Date End Date Taking? Authorizing Provider  aspirin 81 MG tablet Take 81 mg by mouth daily.    [provider]  Multiple Vitamins-Minerals (MULTIVITAMIN ADULT PO) Take 1 tablet by mouth daily.    [provider]  Norethindrone-Ethinyl Estradiol-Fe Biphas (LO LOESTRIN FE) 1 MG-10 MCG / 10 MCG tablet Take 1 tablet by mouth daily.    [provider]  pantoprazole  (PROTONIX ) 40 MG tablet Take 1 tablet (40 mg total) by mouth daily. 08/12/16   Steinl, Kevin, MD  potassium chloride  SA (K-DUR,KLOR-CON ) 20 MEQ tablet Take  1 tablet (20 mEq total) by mouth daily. 08/12/16   Steinl, Kevin, MD    Allergies: Patient has no known allergies.    Review of Systems  Cardiovascular:  Positive for chest pain.  All other systems reviewed and are negative.   Updated Vital Signs BP 107/75   Pulse 82   Temp 98.1 F (36.7 C)   Resp 14   Wt 68 kg   LMP 04/17/2024   SpO2 95%   BMI 25.75 kg/m   Physical Exam Vitals and nursing note reviewed.  Constitutional:      General: She is not in acute distress.    Appearance: She is well-developed.  HENT:     Head: Normocephalic and atraumatic.     Mouth/Throat:     Pharynx: No oropharyngeal exudate.  Eyes:     General: No scleral icterus.       Right eye: No discharge.        Left eye: No discharge.     Conjunctiva/sclera: Conjunctivae normal.     Pupils: Pupils are equal, round, and reactive to light.  Neck:     Thyroid : No thyromegaly.     Vascular: No JVD.  Cardiovascular:     Rate and Rhythm: Normal rate and regular rhythm.     Heart sounds: Normal heart sounds. No murmur heard.    No friction rub. No gallop.  Pulmonary:     Effort: Pulmonary effort is normal. No respiratory  distress.     Breath sounds: Normal breath sounds. No wheezing or rales.  Abdominal:     General: Bowel sounds are normal. There is no distension.     Palpations: Abdomen is soft. There is no mass.     Tenderness: There is no abdominal tenderness.  Musculoskeletal:        General: No tenderness. Normal range of motion.     Cervical back: Normal range of motion and neck supple.     Right lower leg: No edema.     Left lower leg: No edema.  Lymphadenopathy:     Cervical: No cervical adenopathy.  Skin:    General: Skin is warm and dry.     Findings: No erythema or rash.  Neurological:     Mental Status: She is alert.     Coordination: Coordination normal.  Psychiatric:        Behavior: Behavior normal.     (all labs ordered are listed, but only abnormal results are  displayed) Labs Reviewed  BASIC METABOLIC PANEL WITH GFR - Abnormal; Notable for the following components:      Result Value   CO2 20 (*)    BUN 24 (*)    All other components within normal limits  CBC - Abnormal; Notable for the following components:   Platelets 142 (*)    All other components within normal limits  HCG, SERUM, QUALITATIVE  HEPATIC FUNCTION PANEL  LIPASE, BLOOD  BRAIN NATRIURETIC PEPTIDE  MAGNESIUM  D-DIMER, QUANTITATIVE  TSH  TROPONIN I (HIGH SENSITIVITY)  TROPONIN I (HIGH SENSITIVITY)    EKG: EKG Interpretation Date/Time:  Friday May 01 2024 11:02:37 EST Ventricular Rate:  80 PR Interval:  182 QRS Duration:  116 QT Interval:  384 QTC Calculation: 442 R Axis:   6  Text Interpretation: Unusual P axis, possible ectopic atrial rhythm Left ventricular hypertrophy with QRS widening and repolarization abnormality ( Cornell product ) Cannot rule out Septal infarct , age undetermined Abnormal ECG No previous ECGs available Confirmed by Cleotilde Rogue ([id number]) on 05/01/2024 12:30:04 PM  Radiology: DG Chest 2 View Result Date: 05/01/2024 EXAM: 2 VIEW(S) XRAY OF THE CHEST 05/01/2024 11:33:15 AM COMPARISON: 01/15/2018. CLINICAL HISTORY: chest pain FINDINGS: LUNGS AND PLEURA: No focal pulmonary opacity. No pleural effusion. No pneumothorax. HEART AND MEDIASTINUM: No acute abnormality of the cardiac and mediastinal silhouettes. BONES AND SOFT TISSUES: Surgical clips noted. Sternotomy wires noted. Mild dextrocurvature of thoracic spine. IMPRESSION: 1. No acute cardiopulmonary abnormality identified. Electronically signed by: Ryan Chess MD 05/01/2024 11:56 AM EST RP Workstation: [id number]     Procedures   Medications Ordered in the ED - No data to display                                  Medical Decision Making Amount and/or Complexity of Data Reviewed Labs: ordered. Radiology: ordered.    This patient presents to the ED for concern of chest pain with  some shortness of breath, this involves an extensive number of treatment options, and is a complaint that carries with it a high risk of complications and morbidity.  The differential diagnosis includes acute coronary syndrome though that seems unlikely given the patient's overall medical history, could be related to heart failure which seems more likely given her history, could be DVT, she recently took a long trip to [facility] about a month ago though she denies any leg swelling  Co morbidities / Chronic conditions that complicate the patient evaluation  This could be related to heart failure which seems more likely   Additional history obtained:  Additional history obtained from EMR External records from outside source obtained and reviewed including [facility] records, cardiology records   Lab Tests:  I Ordered, and personally interpreted labs.  The pertinent results include: CBC metabolic panel troponin and D-dimer all unremarkable, BNP unremarkable   Imaging Studies ordered:  I ordered imaging studies including chest x-ray I independently visualized and interpreted imaging which showed no acute findings I agree with the radiologist interpretation   Cardiac Monitoring: / EKG:  The patient was maintained on a cardiac monitor.  I personally viewed and interpreted the cardiac monitored which showed an underlying rhythm of: Sinus rhythm in the 80s   Problem List / ED Course / Critical interventions / Medication management  Chest pain of unclear etiology, there is no evidence of pathology causing chest pain or shortness of breath and the patient is fine at rest The patient feels comfortable with the plan of discharge I have reviewed the patients home medicines and have made adjustments as needed   Social Determinants of Health:  Congenital heart disease status post surgery   Test / Admission - Considered:  Considered admission but unremarkable workup, normal vitals, patient  agreeable to the plan for discharge      Final diagnoses:  Chest pain, unspecified type    ED Discharge Orders     None          Cleotilde Rogue, MD 05/01/24 1451

## 2024-05-01 NOTE — Discharge Instructions (Signed)
 Thankfully all of your testing has been reassuring, there is no signs of blood clot, no signs of heart attack, everything was very reassuring.  That being said we do not know the exact cause of your symptoms though it could in someway be related to the early congestive heart failure.  Your blood work and x-ray did not show any signs of fluid on your lungs, no signs of enlarged heart, you should be able to follow-up with your cardiologist on Monday, please call them today to make this appointment.  Return to the ER for worsening symptoms

## 2024-05-01 NOTE — ED Notes (Signed)
Pt ambulated to the bathroom and back.  Tolerated well.

## 2024-05-01 NOTE — ED Triage Notes (Signed)
 Pt with hx of tricuspid atresia and known heart failure is here for chest pain and pressure which began last pm.  She spoke with her cardiologist at [facility] who advised her to come to ED for workup.  Pt reports central chest Heaviness and some sob.

## 2024-05-01 NOTE — ED Provider Triage Note (Signed)
 Emergency Medicine Provider Triage Evaluation Note  Brianna Matthews , a 33 y.o. female  was evaluated in triage.  Pt complains of left central chest discomfort that began after exercise yesterday.  Review of Systems  Positive: Chest pressure, tightness, mild shortness of breath Negative: Constipation, diarrhea, urinary changes.  Denies leg pain or leg swelling.  Denies trauma.  No palpitations today.  No recent medication changes  Physical Exam  BP 122/81 (BP Location: Right Arm)   Pulse 80   Temp 98.1 F (36.7 C)   Resp 14   Wt 68 kg   LMP 04/17/2024   SpO2 93%   BMI 25.75 kg/m  Gen:   Awake, no distress   Resp:  Normal effort  MSK:   Moves extremities without difficulty  Other:  Nontender chest or abdomen.  Slight murmur.  Medical Decision Making  Medically screening exam initiated at 11:47 AM.  Appropriate orders placed.  Brianna Matthews was informed that the remainder of the evaluation will be completed by another provider, this initial triage assessment does not replace that evaluation, and the importance of remaining in the ED until their evaluation is complete.  Brianna Matthews is a 33 y.o. female with a past medical history significant for congenital heart disease status post cardiac surgery and heart failure managed by [facility] cardiology who presents with chest pain.  According to patient, she is an elliptical which she is not used to using yesterday and exert herself and then since then is having chest discomfort.  She reports it as a central chest heaviness and pressure with some occasional shortness of breath.  She reports no palpitations.  She reports occasional nausea but no vomiting and this is chronic for her.  She also reported some abdominal, this morning that was mild and since has resolved.  She denies any constipation, diarrhea, or urinary changes.  She denies any fevers, chills, congestion, or cough.  Denies leg pain or leg swelling.  She called her [facility]  cardiology team and told her to come in for evaluation as the chest pain has been persistent today.  On exam, lungs clear.  Chest was nontender.  I did hear murmur.  Abdomen nontender.  Good bowel sounds.  Intact sensation strength and pulses in extremities.  No leg tenderness or edema.  Patient otherwise well-appearing.  No back or flank tenderness.  Vital signs reassuring on arrival.  EKG appears abnormal but I do not see STEMI.  Given the patient's history of heart failure and heart disease and new chest discomfort, we will get workup including labs and chest x-ray.  We will get a BNP given her heart failure history and due to the report that she has occasional palpitations but not today we will get some labs including magnesium and TSH.  Patient symptoms may relate to muscle skeletal pain as she was in elliptical using her arms however with her cardiac history we will make sure she does not have a cardiac etiology.  Anticipate evaluation when she gets to an examination room with a provider.    Berry Godsey, Lonni PARAS, MD 05/01/24 1158
